# Patient Record
Sex: Male | Born: 1950 | Race: White | Hispanic: No | Marital: Married | Smoking: Never smoker
Health system: Southern US, Community
[De-identification: ages and names within clinical notes are randomized; demographics above are authoritative.]

## PROBLEM LIST (undated history)

## (undated) DIAGNOSIS — K229 Disease of esophagus, unspecified: Secondary | ICD-10-CM

---

## 1998-09-22 ENCOUNTER — Encounter: Payer: Self-pay | Admitting: Neurosurgery

## 1998-09-22 ENCOUNTER — Ambulatory Visit (HOSPITAL_COMMUNITY): Admission: RE | Admit: 1998-09-22 | Discharge: 1998-09-22 | Payer: Self-pay | Admitting: Neurosurgery

## 2006-04-05 ENCOUNTER — Ambulatory Visit (HOSPITAL_COMMUNITY): Admission: RE | Admit: 2006-04-05 | Discharge: 2006-04-05 | Payer: Self-pay | Admitting: Gastroenterology

## 2006-04-06 ENCOUNTER — Ambulatory Visit: Payer: Self-pay | Admitting: Gastroenterology

## 2006-05-11 ENCOUNTER — Ambulatory Visit: Payer: Self-pay | Admitting: Gastroenterology

## 2006-05-17 ENCOUNTER — Ambulatory Visit (HOSPITAL_COMMUNITY): Admission: RE | Admit: 2006-05-17 | Discharge: 2006-05-17 | Payer: Self-pay | Admitting: Gastroenterology

## 2006-05-23 ENCOUNTER — Ambulatory Visit: Payer: Self-pay | Admitting: Gastroenterology

## 2010-07-04 ENCOUNTER — Emergency Department (HOSPITAL_BASED_OUTPATIENT_CLINIC_OR_DEPARTMENT_OTHER)
Admission: EM | Admit: 2010-07-04 | Discharge: 2010-07-04 | Payer: Self-pay | Source: Home / Self Care | Admitting: Emergency Medicine

## 2010-12-11 NOTE — Op Note (Signed)
NAME:  Victor Deleon, Victor Deleon                ACCOUNT NO.:  [id number]   MEDICAL RECORD NO.:  [id number]          PATIENT TYPE:  AMB   LOCATION:  ENDO                         FACILITY:  [facility]   PHYSICIAN:  Jefry H. Pollyann Kennedy, MD     DATE OF BIRTH:  February 20, 1951   DATE OF PROCEDURE:  04/05/2006  DATE OF DISCHARGE:  04/05/2006                                 OPERATIVE REPORT   PREOPERATIVE DIAGNOSIS:  Foreign body in esophagus.   POSTOPERATIVE DIAGNOSIS:  Dysphasia.   PROCEDURE:  Rigid esophagoscopy.   SURGEON:  Jefry H. Pollyann Kennedy, M.D.   This is done in conjunction with flexible upper endoscopy as performed by  Dr. Christella Hartigan.   COMPLICATIONS:  None.   FINDINGS:  None.   DISPOSITION:  The patient tolerated the procedure, was extubated, and  transferred to recovery in stable condition.   HISTORY:  60 year old gentleman who actually swallowed a whole almond and  was having difficulty swallowing.  He was undergoing esophagoscopy with Dr.  Christella Hartigan in the endoscopy lab and Dr. 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  The risks,  benefits, alternatives, and complications of the procedure were explained to  the patient who seemed to understand and agreed to the surgery.   PROCEDURE:  The patient was taken to the operating room and placed on the  operating table in a supine position.  Following induction of general  endotracheal anesthesia, the table was turned and upper and lower tooth  protectors were used.  A short and long rigid esophagoscope were used,  entered into the cricopharyngeus and down to the limits of the scope where  no foreign objects were identified.  The scopes were then removed and then  the patient's care was handed over to Dr. Christella Hartigan for the flexible upper  endoscopy.  Following  the procedure, the patient was awakened, extubated,  and transferred to recovery in stable condition.  There is no blood loss.  No complications.  No findings.      Jefry H. Pollyann Kennedy, MD  Electronically Signed     JHR/MEDQ  D:  04/06/2006  T:  04/06/2006  Job:  [id number]

## 2011-08-27 IMAGING — CR DG FOOT COMPLETE 3+V*L*
3 series · 3 of 3 positions shown · non-contrast
Comparison: None.

CLINICAL DATA: Lateral left foot pain with bruising and swelling.

LEFT FOOT - COMPLETE 3+ VIEW

[t foot ap left]
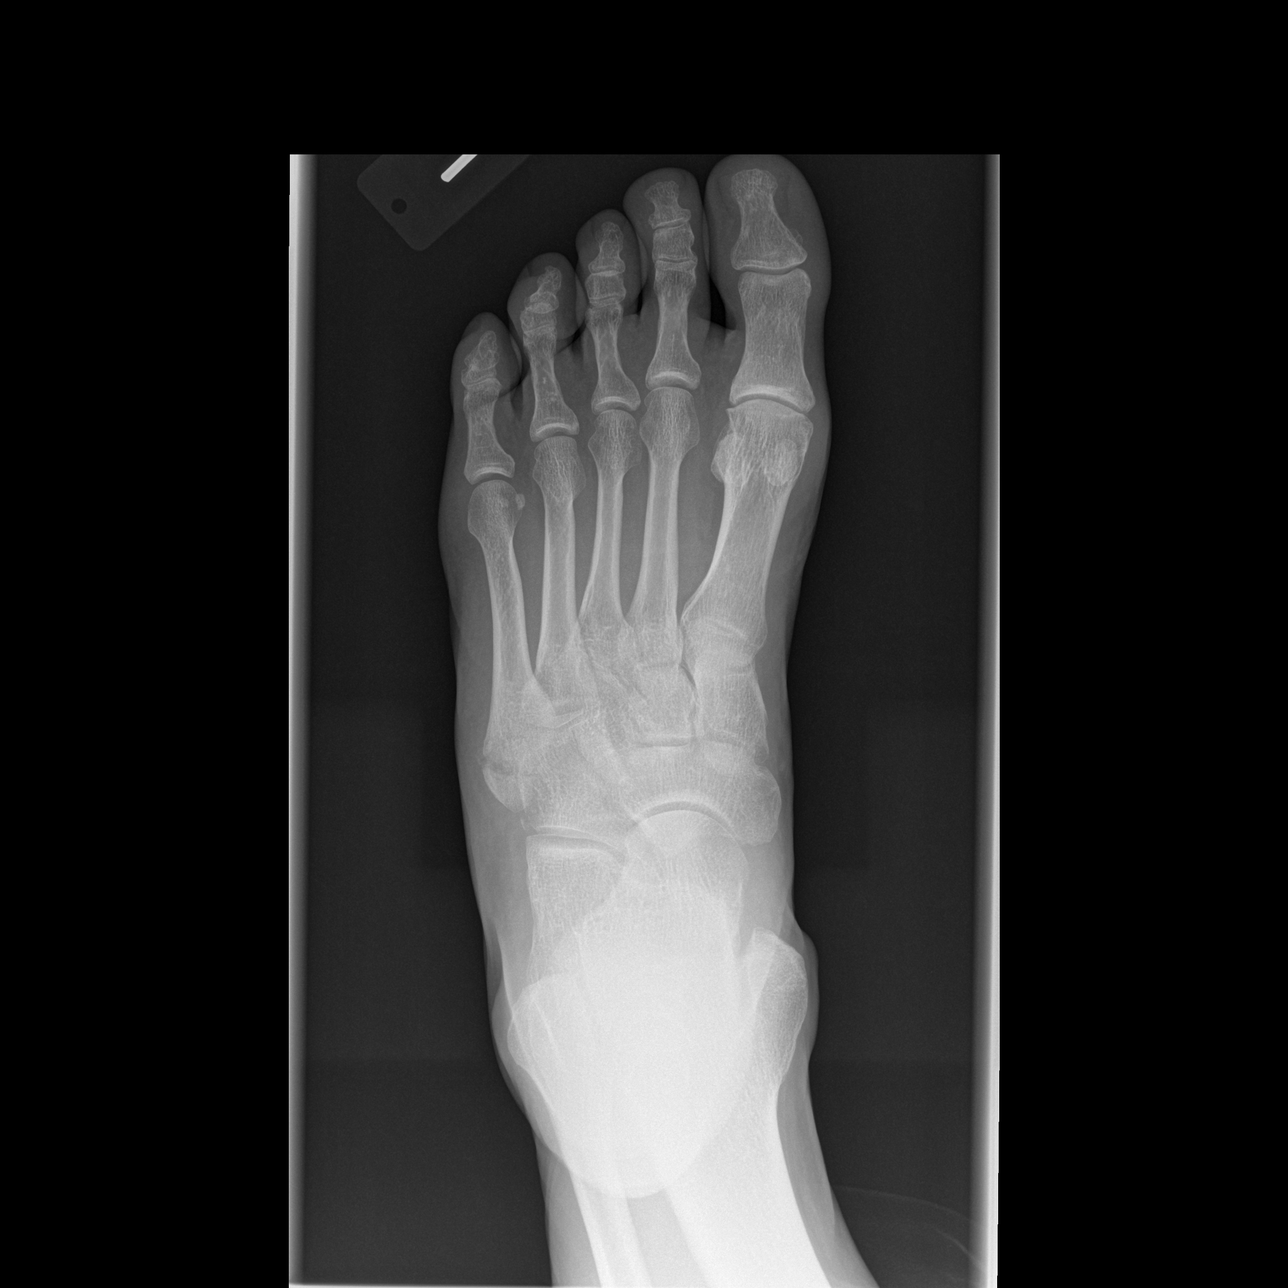

[t foot oblique left]
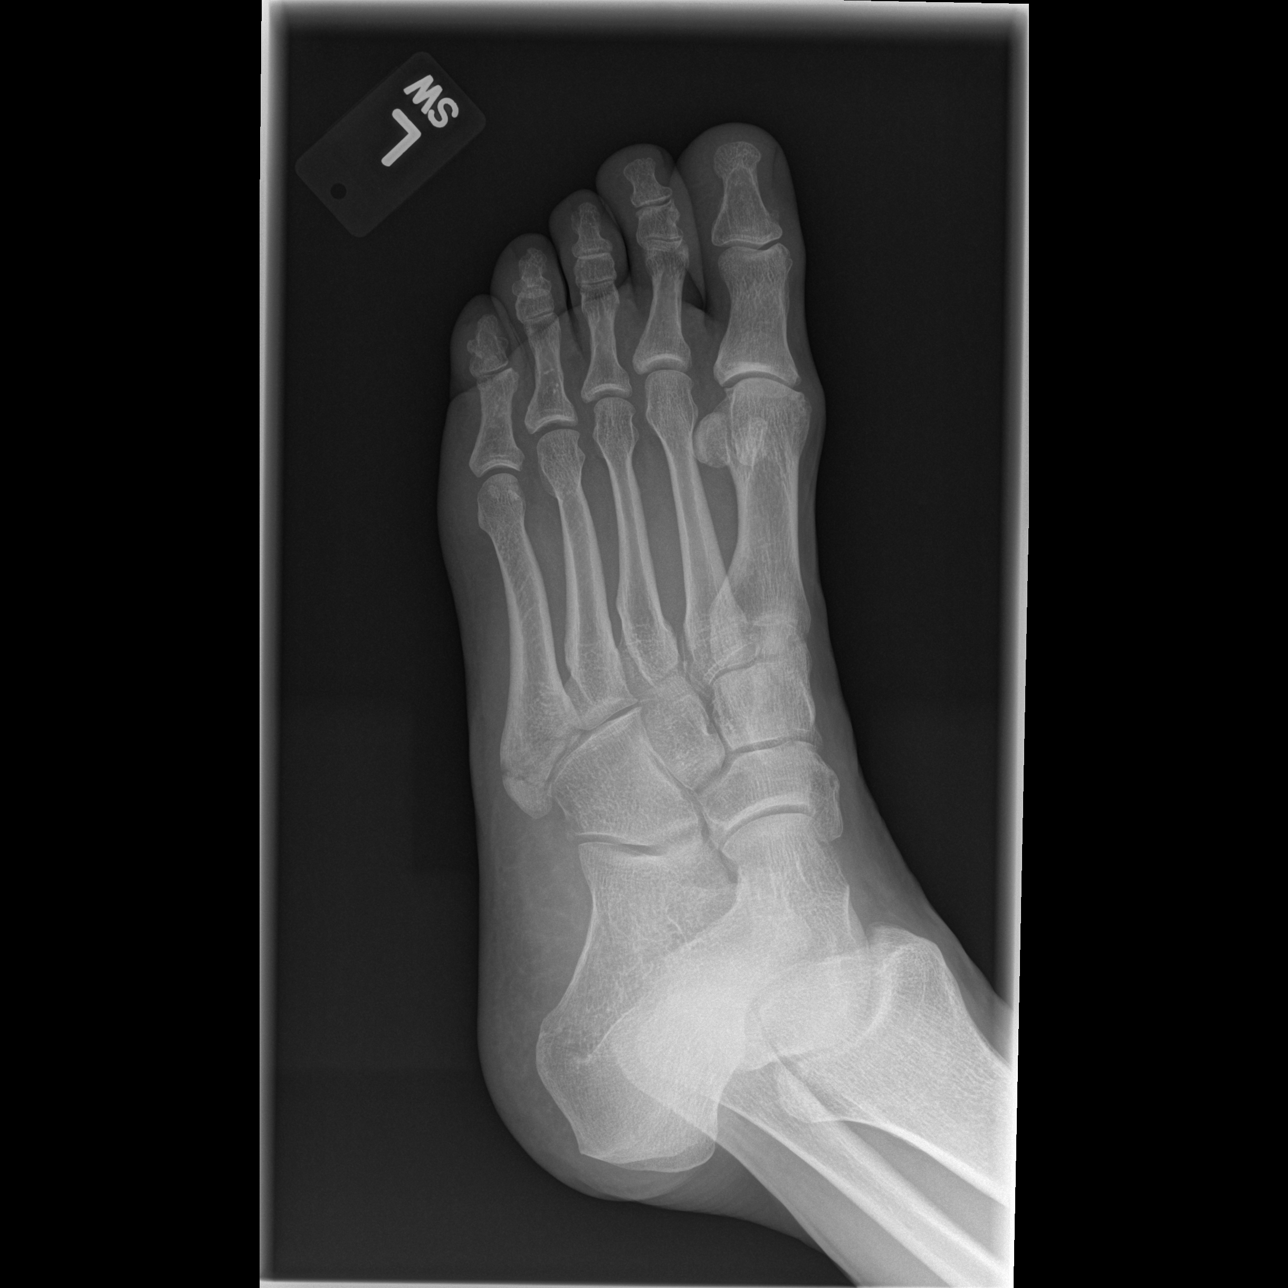

[t foot lat left]
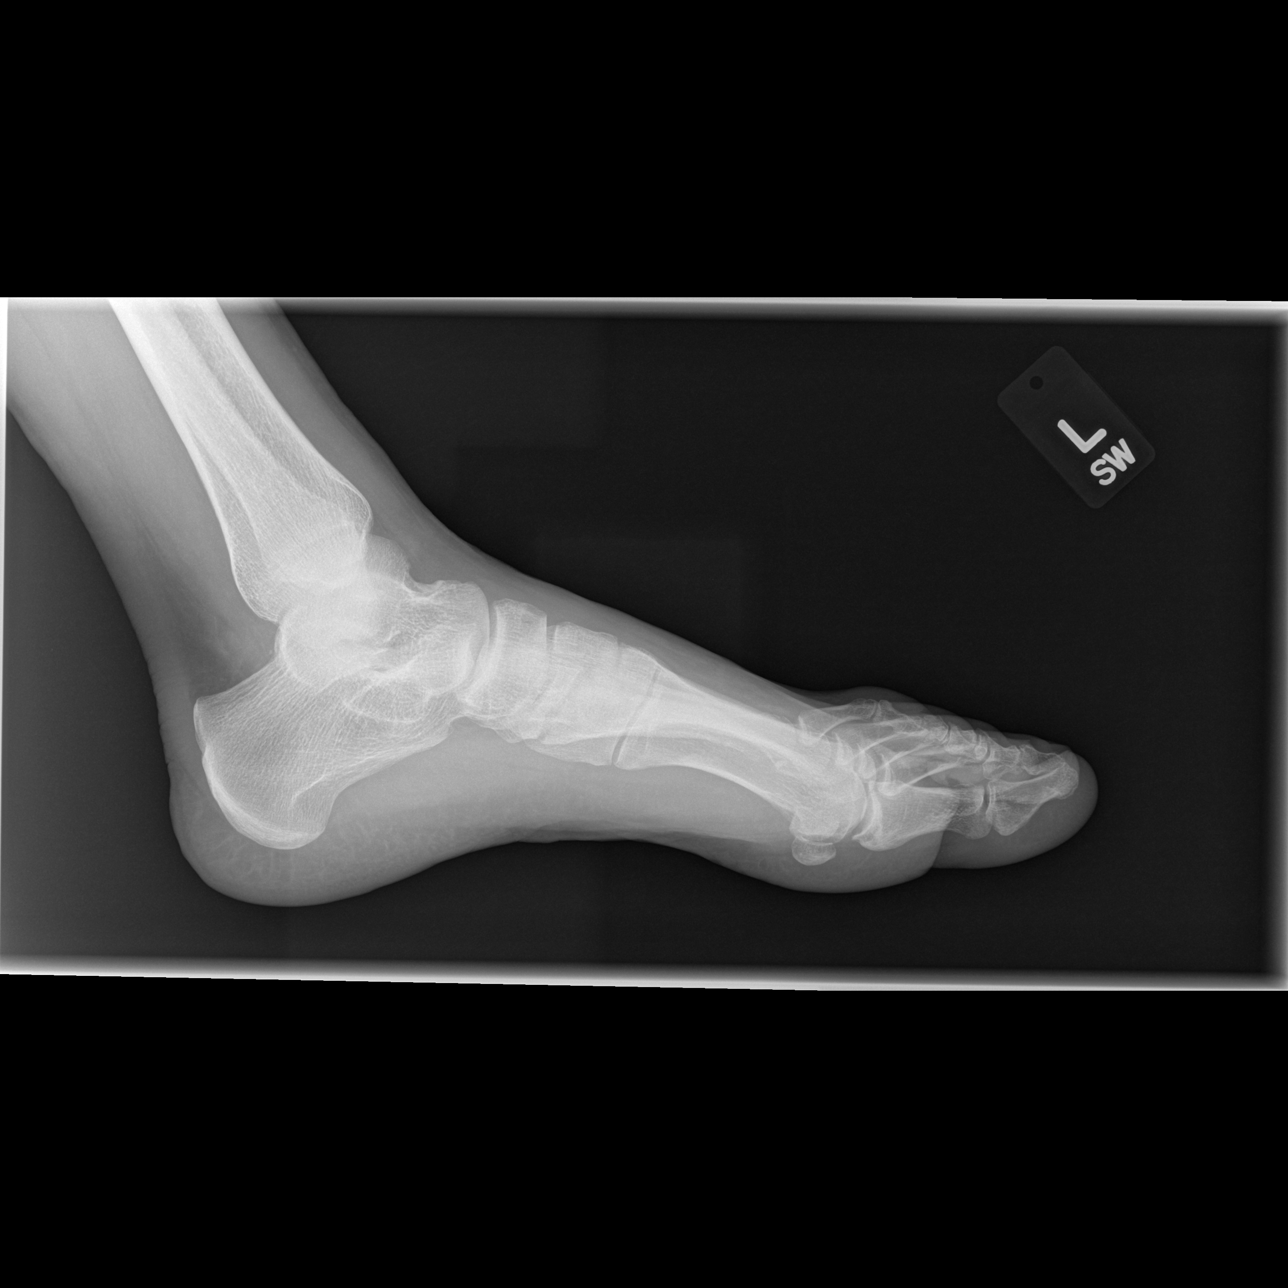

[3 of 3 positions shown; findings below may reference images not displayed]

FINDINGS: There is a minimally displaced fracture at the base of
the fifth metatarsal, with overlying soft tissue swelling.  This
may be a two- or three-part fracture, and there are lucencies along
the medial aspect of the fifth metatarsal base as well, on the
oblique view.  Degenerative changes at the first metatarsal
phalangeal joint.
IMPRESSION: 1.  Fracture at the base of the fifth metatarsal may be a two- or
three-part fracture.
2.  First metatarsal phalangeal joint osteoarthritis.

## 2014-11-21 ENCOUNTER — Encounter: Payer: Self-pay | Admitting: *Deleted

## 2014-11-21 ENCOUNTER — Emergency Department
Admission: EM | Admit: 2014-11-21 | Discharge: 2014-11-21 | Disposition: A | Discharge status: Home / Self Care | Payer: Medicare Other | Source: Home / Self Care | Attending: Emergency Medicine | Admitting: Emergency Medicine

## 2014-11-21 DIAGNOSIS — H578 Other specified disorders of eye and adnexa: Secondary | ICD-10-CM

## 2014-11-21 DIAGNOSIS — H5789 Other specified disorders of eye and adnexa: Secondary | ICD-10-CM

## 2014-11-21 HISTORY — DX: Disease of esophagus, unspecified: K22.9

## 2014-11-21 MED ORDER — POLYMYXIN B-TRIMETHOPRIM 10000-0.1 UNIT/ML-% OP SOLN: 1.0000 [drp] | Freq: Four times a day (QID) | OPHTHALMIC | Status: AC

## 2014-11-21 MED ORDER — PREDNISONE 10 MG (21) PO TBPK: 10.0000 mg | ORAL_TABLET | Freq: Every day | ORAL | Status: AC

## 2014-11-21 NOTE — ED Provider Notes (Signed)
CSN: [id number]     Arrival date & time 11/21/14  1939 History   First MD Initiated Contact with Patient 11/21/14 1942     Chief Complaint  Patient presents with  . Eye Pain    right   (Consider location/radiation/quality/duration/timing/severity/associated sxs/prior Treatment) HPI Victor Deleon presents today with an EYE COMPLAINT  Location: right  Onset: 2  Days   Symptoms: Redness: yes, lower eyelid only Discharge: no Pain: yes, central lower eyelid only Photophobia: no Decreased Vision: no URI symptoms: yes, last week Itching/Allergy sxs: no Glaucoma: no Recent eye surgery: no Contact lens use: no  Red Flags Trauma: no.  He mowed the yard/field about 5 days ago.  They also state that they have a lot of poison ivy on their property. Foreign Body: no Vomiting/HA: no Halos around lights: no Chickenpox or zoster: no    Past Medical History  Diagnosis Date  . Esophageal abnormality    History reviewed. No pertinent past surgical history. History reviewed. No pertinent family history. History  Substance Use Topics  . Smoking status: Never Smoker   . Smokeless tobacco: Never Used  . Alcohol Use: Yes    Review of Systems  All other systems reviewed and are negative.   Allergies  Review of patient's allergies indicates no known allergies.  Home Medications   Prior to Admission medications   Medication Sig Start Date End Date Taking? Authorizing Provider  predniSONE (STERAPRED UNI-PAK 21 TAB) 10 MG (21) TBPK tablet Take 1 tablet (10 mg total) by mouth daily. 6 day pack, use as directed 11/21/14   Marlaine HindJeffrey H Jermie Hippe, MD  trimethoprim-polymyxin b (POLYTRIM) ophthalmic solution Place 1 drop into the right eye every 6 (six) hours. 11/21/14   Marlaine HindJeffrey H Lynzee Lindquist, MD   BP 124/64 mmHg  Pulse 58  Temp(Src) 98.2 F (36.8 C) (Oral)  Resp 14  Ht 5\' 2"  (1.575 m)  Wt 183 lb (83.008 kg)  BMI 33.46 kg/m2  SpO2 96% Physical Exam  Constitutional: He is oriented to person,  place, and time. He appears well-developed and well-nourished.  HENT:  Head: Normocephalic and atraumatic.  Eyes: EOM are normal. Pupils are equal, round, and reactive to light. Lids are everted and swept, no foreign bodies found. Right eye exhibits no chemosis, no discharge and no exudate. Left eye exhibits no chemosis, no discharge and no exudate. Right conjunctiva is injected. Left conjunctiva is not injected. No scleral icterus.    No signs of a hordeolum or chalazion.  No foreign body seen.  No ocular involvement. Funduscopic examination appears normal.  Neck: Neck supple.  Cardiovascular: Regular rhythm and normal heart sounds.   Pulmonary/Chest: Effort normal and breath sounds normal. No respiratory distress.  Neurological: He is alert and oriented to person, place, and time.  Skin: Skin is warm and dry.  Psychiatric: He has a normal mood and affect. His speech is normal.  Nursing note and vitals reviewed.   ED Course  Procedures (including critical care time) Labs Review Labs Reviewed - No data to display  Imaging Review No results found.   MDM   1. Redness of eye, right     I discussed the differential diagnosis with the patient which includes an early stye, contact dermatitis (poison ivy), early orbital cellulitis, corneal abrasion, conjunctivitis.  I feel the most common possibilities are a stye or poison ivy. No signs of orbital or periorbital cellulitis. We'll give aprescription for Polytrim eyedrops that he will try first as well as warm compresses.  If not improving, will then try prednisone.  If then not improving, can call back and we will call in a prescription of Keflex [phone] at that point.if any change in vision, make appointment with ophthalmology.   Marlaine Hind, MD 11/21/14 2021

## 2014-11-21 NOTE — ED Notes (Signed)
Pt c/o right eye pain and redness without drainage or injury x 2 days.

## 2022-01-11 DIAGNOSIS — H524 Presbyopia: Secondary | ICD-10-CM | POA: Diagnosis not present

## 2022-03-03 DIAGNOSIS — Z125 Encounter for screening for malignant neoplasm of prostate: Secondary | ICD-10-CM | POA: Diagnosis not present

## 2022-03-03 DIAGNOSIS — Z136 Encounter for screening for cardiovascular disorders: Secondary | ICD-10-CM | POA: Diagnosis not present

## 2022-03-03 DIAGNOSIS — Z1211 Encounter for screening for malignant neoplasm of colon: Secondary | ICD-10-CM | POA: Diagnosis not present

## 2022-03-03 DIAGNOSIS — Z0000 Encounter for general adult medical examination without abnormal findings: Secondary | ICD-10-CM | POA: Diagnosis not present

## 2022-03-03 DIAGNOSIS — Z23 Encounter for immunization: Secondary | ICD-10-CM | POA: Diagnosis not present

## 2022-03-26 DIAGNOSIS — Z1212 Encounter for screening for malignant neoplasm of rectum: Secondary | ICD-10-CM | POA: Diagnosis not present

## 2022-03-26 DIAGNOSIS — Z1211 Encounter for screening for malignant neoplasm of colon: Secondary | ICD-10-CM | POA: Diagnosis not present

## 2022-08-11 DIAGNOSIS — R293 Abnormal posture: Secondary | ICD-10-CM | POA: Diagnosis not present

## 2022-08-11 DIAGNOSIS — M25652 Stiffness of left hip, not elsewhere classified: Secondary | ICD-10-CM | POA: Diagnosis not present

## 2022-08-11 DIAGNOSIS — M25651 Stiffness of right hip, not elsewhere classified: Secondary | ICD-10-CM | POA: Diagnosis not present

## 2022-08-11 DIAGNOSIS — R262 Difficulty in walking, not elsewhere classified: Secondary | ICD-10-CM | POA: Diagnosis not present

## 2022-08-13 DIAGNOSIS — M25651 Stiffness of right hip, not elsewhere classified: Secondary | ICD-10-CM | POA: Diagnosis not present

## 2022-08-13 DIAGNOSIS — M25652 Stiffness of left hip, not elsewhere classified: Secondary | ICD-10-CM | POA: Diagnosis not present

## 2022-08-13 DIAGNOSIS — R262 Difficulty in walking, not elsewhere classified: Secondary | ICD-10-CM | POA: Diagnosis not present

## 2022-08-13 DIAGNOSIS — R293 Abnormal posture: Secondary | ICD-10-CM | POA: Diagnosis not present

## 2022-08-16 DIAGNOSIS — R262 Difficulty in walking, not elsewhere classified: Secondary | ICD-10-CM | POA: Diagnosis not present

## 2022-08-16 DIAGNOSIS — M25651 Stiffness of right hip, not elsewhere classified: Secondary | ICD-10-CM | POA: Diagnosis not present

## 2022-08-16 DIAGNOSIS — R293 Abnormal posture: Secondary | ICD-10-CM | POA: Diagnosis not present

## 2022-08-16 DIAGNOSIS — M25652 Stiffness of left hip, not elsewhere classified: Secondary | ICD-10-CM | POA: Diagnosis not present

## 2022-08-17 DIAGNOSIS — K080 Exfoliation of teeth due to systemic causes: Secondary | ICD-10-CM | POA: Diagnosis not present

## 2022-08-18 DIAGNOSIS — R293 Abnormal posture: Secondary | ICD-10-CM | POA: Diagnosis not present

## 2022-08-18 DIAGNOSIS — M25651 Stiffness of right hip, not elsewhere classified: Secondary | ICD-10-CM | POA: Diagnosis not present

## 2022-08-18 DIAGNOSIS — R262 Difficulty in walking, not elsewhere classified: Secondary | ICD-10-CM | POA: Diagnosis not present

## 2022-08-18 DIAGNOSIS — M25652 Stiffness of left hip, not elsewhere classified: Secondary | ICD-10-CM | POA: Diagnosis not present

## 2022-08-23 DIAGNOSIS — R293 Abnormal posture: Secondary | ICD-10-CM | POA: Diagnosis not present

## 2022-08-23 DIAGNOSIS — R262 Difficulty in walking, not elsewhere classified: Secondary | ICD-10-CM | POA: Diagnosis not present

## 2022-08-23 DIAGNOSIS — M25652 Stiffness of left hip, not elsewhere classified: Secondary | ICD-10-CM | POA: Diagnosis not present

## 2022-08-23 DIAGNOSIS — M25651 Stiffness of right hip, not elsewhere classified: Secondary | ICD-10-CM | POA: Diagnosis not present

## 2022-08-25 DIAGNOSIS — M25651 Stiffness of right hip, not elsewhere classified: Secondary | ICD-10-CM | POA: Diagnosis not present

## 2022-08-25 DIAGNOSIS — M25652 Stiffness of left hip, not elsewhere classified: Secondary | ICD-10-CM | POA: Diagnosis not present

## 2022-08-25 DIAGNOSIS — R262 Difficulty in walking, not elsewhere classified: Secondary | ICD-10-CM | POA: Diagnosis not present

## 2022-08-25 DIAGNOSIS — R293 Abnormal posture: Secondary | ICD-10-CM | POA: Diagnosis not present

## 2022-08-27 DIAGNOSIS — R293 Abnormal posture: Secondary | ICD-10-CM | POA: Diagnosis not present

## 2022-08-27 DIAGNOSIS — R262 Difficulty in walking, not elsewhere classified: Secondary | ICD-10-CM | POA: Diagnosis not present

## 2022-08-27 DIAGNOSIS — M25652 Stiffness of left hip, not elsewhere classified: Secondary | ICD-10-CM | POA: Diagnosis not present

## 2022-08-27 DIAGNOSIS — M25651 Stiffness of right hip, not elsewhere classified: Secondary | ICD-10-CM | POA: Diagnosis not present

## 2022-08-30 DIAGNOSIS — M25652 Stiffness of left hip, not elsewhere classified: Secondary | ICD-10-CM | POA: Diagnosis not present

## 2022-08-30 DIAGNOSIS — R262 Difficulty in walking, not elsewhere classified: Secondary | ICD-10-CM | POA: Diagnosis not present

## 2022-08-30 DIAGNOSIS — R293 Abnormal posture: Secondary | ICD-10-CM | POA: Diagnosis not present

## 2022-08-30 DIAGNOSIS — M25651 Stiffness of right hip, not elsewhere classified: Secondary | ICD-10-CM | POA: Diagnosis not present

## 2022-09-01 DIAGNOSIS — R262 Difficulty in walking, not elsewhere classified: Secondary | ICD-10-CM | POA: Diagnosis not present

## 2022-09-01 DIAGNOSIS — M25652 Stiffness of left hip, not elsewhere classified: Secondary | ICD-10-CM | POA: Diagnosis not present

## 2022-09-01 DIAGNOSIS — M25651 Stiffness of right hip, not elsewhere classified: Secondary | ICD-10-CM | POA: Diagnosis not present

## 2022-09-01 DIAGNOSIS — R293 Abnormal posture: Secondary | ICD-10-CM | POA: Diagnosis not present

## 2022-09-03 DIAGNOSIS — M25652 Stiffness of left hip, not elsewhere classified: Secondary | ICD-10-CM | POA: Diagnosis not present

## 2022-09-03 DIAGNOSIS — M25651 Stiffness of right hip, not elsewhere classified: Secondary | ICD-10-CM | POA: Diagnosis not present

## 2022-09-03 DIAGNOSIS — R293 Abnormal posture: Secondary | ICD-10-CM | POA: Diagnosis not present

## 2022-09-03 DIAGNOSIS — R262 Difficulty in walking, not elsewhere classified: Secondary | ICD-10-CM | POA: Diagnosis not present

## 2022-09-15 DIAGNOSIS — R293 Abnormal posture: Secondary | ICD-10-CM | POA: Diagnosis not present

## 2022-09-15 DIAGNOSIS — M25652 Stiffness of left hip, not elsewhere classified: Secondary | ICD-10-CM | POA: Diagnosis not present

## 2022-09-15 DIAGNOSIS — R262 Difficulty in walking, not elsewhere classified: Secondary | ICD-10-CM | POA: Diagnosis not present

## 2022-09-15 DIAGNOSIS — M25651 Stiffness of right hip, not elsewhere classified: Secondary | ICD-10-CM | POA: Diagnosis not present

## 2022-09-22 DIAGNOSIS — R262 Difficulty in walking, not elsewhere classified: Secondary | ICD-10-CM | POA: Diagnosis not present

## 2022-09-22 DIAGNOSIS — M25651 Stiffness of right hip, not elsewhere classified: Secondary | ICD-10-CM | POA: Diagnosis not present

## 2022-09-22 DIAGNOSIS — R293 Abnormal posture: Secondary | ICD-10-CM | POA: Diagnosis not present

## 2022-09-22 DIAGNOSIS — M25652 Stiffness of left hip, not elsewhere classified: Secondary | ICD-10-CM | POA: Diagnosis not present

## 2022-10-06 DIAGNOSIS — R262 Difficulty in walking, not elsewhere classified: Secondary | ICD-10-CM | POA: Diagnosis not present

## 2022-10-06 DIAGNOSIS — M25651 Stiffness of right hip, not elsewhere classified: Secondary | ICD-10-CM | POA: Diagnosis not present

## 2022-10-06 DIAGNOSIS — R293 Abnormal posture: Secondary | ICD-10-CM | POA: Diagnosis not present

## 2022-10-06 DIAGNOSIS — M25652 Stiffness of left hip, not elsewhere classified: Secondary | ICD-10-CM | POA: Diagnosis not present

## 2022-10-13 DIAGNOSIS — M25652 Stiffness of left hip, not elsewhere classified: Secondary | ICD-10-CM | POA: Diagnosis not present

## 2022-10-13 DIAGNOSIS — R262 Difficulty in walking, not elsewhere classified: Secondary | ICD-10-CM | POA: Diagnosis not present

## 2022-10-13 DIAGNOSIS — R293 Abnormal posture: Secondary | ICD-10-CM | POA: Diagnosis not present

## 2022-10-13 DIAGNOSIS — M25651 Stiffness of right hip, not elsewhere classified: Secondary | ICD-10-CM | POA: Diagnosis not present

## 2022-11-01 DIAGNOSIS — M25651 Stiffness of right hip, not elsewhere classified: Secondary | ICD-10-CM | POA: Diagnosis not present

## 2022-11-01 DIAGNOSIS — R293 Abnormal posture: Secondary | ICD-10-CM | POA: Diagnosis not present

## 2022-11-01 DIAGNOSIS — M25652 Stiffness of left hip, not elsewhere classified: Secondary | ICD-10-CM | POA: Diagnosis not present

## 2022-11-01 DIAGNOSIS — R262 Difficulty in walking, not elsewhere classified: Secondary | ICD-10-CM | POA: Diagnosis not present

## 2022-11-30 DIAGNOSIS — R262 Difficulty in walking, not elsewhere classified: Secondary | ICD-10-CM | POA: Diagnosis not present

## 2022-11-30 DIAGNOSIS — M25651 Stiffness of right hip, not elsewhere classified: Secondary | ICD-10-CM | POA: Diagnosis not present

## 2022-11-30 DIAGNOSIS — R293 Abnormal posture: Secondary | ICD-10-CM | POA: Diagnosis not present

## 2022-11-30 DIAGNOSIS — M25652 Stiffness of left hip, not elsewhere classified: Secondary | ICD-10-CM | POA: Diagnosis not present

## 2023-02-21 DIAGNOSIS — K080 Exfoliation of teeth due to systemic causes: Secondary | ICD-10-CM | POA: Diagnosis not present

## 2023-03-09 DIAGNOSIS — E782 Mixed hyperlipidemia: Secondary | ICD-10-CM | POA: Diagnosis not present

## 2023-03-09 DIAGNOSIS — Z0000 Encounter for general adult medical examination without abnormal findings: Secondary | ICD-10-CM | POA: Diagnosis not present

## 2023-03-09 DIAGNOSIS — Z125 Encounter for screening for malignant neoplasm of prostate: Secondary | ICD-10-CM | POA: Diagnosis not present

## 2023-03-18 DIAGNOSIS — D3132 Benign neoplasm of left choroid: Secondary | ICD-10-CM | POA: Diagnosis not present

## 2023-03-18 DIAGNOSIS — H35371 Puckering of macula, right eye: Secondary | ICD-10-CM | POA: Diagnosis not present

## 2023-03-18 DIAGNOSIS — M79641 Pain in right hand: Secondary | ICD-10-CM | POA: Diagnosis not present

## 2023-03-18 DIAGNOSIS — H353233 Exudative age-related macular degeneration, bilateral, with inactive scar: Secondary | ICD-10-CM | POA: Diagnosis not present

## 2023-09-05 DIAGNOSIS — K080 Exfoliation of teeth due to systemic causes: Secondary | ICD-10-CM | POA: Diagnosis not present

## 2024-03-27 DIAGNOSIS — Z1331 Encounter for screening for depression: Secondary | ICD-10-CM | POA: Diagnosis not present

## 2024-03-27 DIAGNOSIS — Z131 Encounter for screening for diabetes mellitus: Secondary | ICD-10-CM | POA: Diagnosis not present

## 2024-03-27 DIAGNOSIS — Z125 Encounter for screening for malignant neoplasm of prostate: Secondary | ICD-10-CM | POA: Diagnosis not present

## 2024-03-27 DIAGNOSIS — Z0000 Encounter for general adult medical examination without abnormal findings: Secondary | ICD-10-CM | POA: Diagnosis not present

## 2024-03-27 DIAGNOSIS — Z136 Encounter for screening for cardiovascular disorders: Secondary | ICD-10-CM | POA: Diagnosis not present

## 2024-04-25 DIAGNOSIS — H35371 Puckering of macula, right eye: Secondary | ICD-10-CM | POA: Diagnosis not present

## 2024-04-25 DIAGNOSIS — H2513 Age-related nuclear cataract, bilateral: Secondary | ICD-10-CM | POA: Diagnosis not present
